# Patient Record
Sex: Male | Born: 1995 | Race: White | Hispanic: No | Marital: Single | Smoking: Never smoker
Health system: Southern US, Community
[De-identification: ages and names within clinical notes are randomized; demographics above are authoritative.]

## PROBLEM LIST (undated history)

## (undated) DIAGNOSIS — F988 Other specified behavioral and emotional disorders with onset usually occurring in childhood and adolescence: Secondary | ICD-10-CM

## (undated) DIAGNOSIS — F909 Attention-deficit hyperactivity disorder, unspecified type: Secondary | ICD-10-CM

## (undated) HISTORY — DX: Attention-deficit hyperactivity disorder, unspecified type: F90.9

---
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
## 2015-05-30 IMAGING — CR DG FEMUR 2+V*L*
4 series · 4 of 4 positions shown · non-contrast
Comparison: None.

CLINICAL DATA: Status post fall while playing in the snow today
with penetrated wound of the left mid posterior thigh.

EXAM:
DG FEMUR 2+V*L*

[x femur proximal ap left]
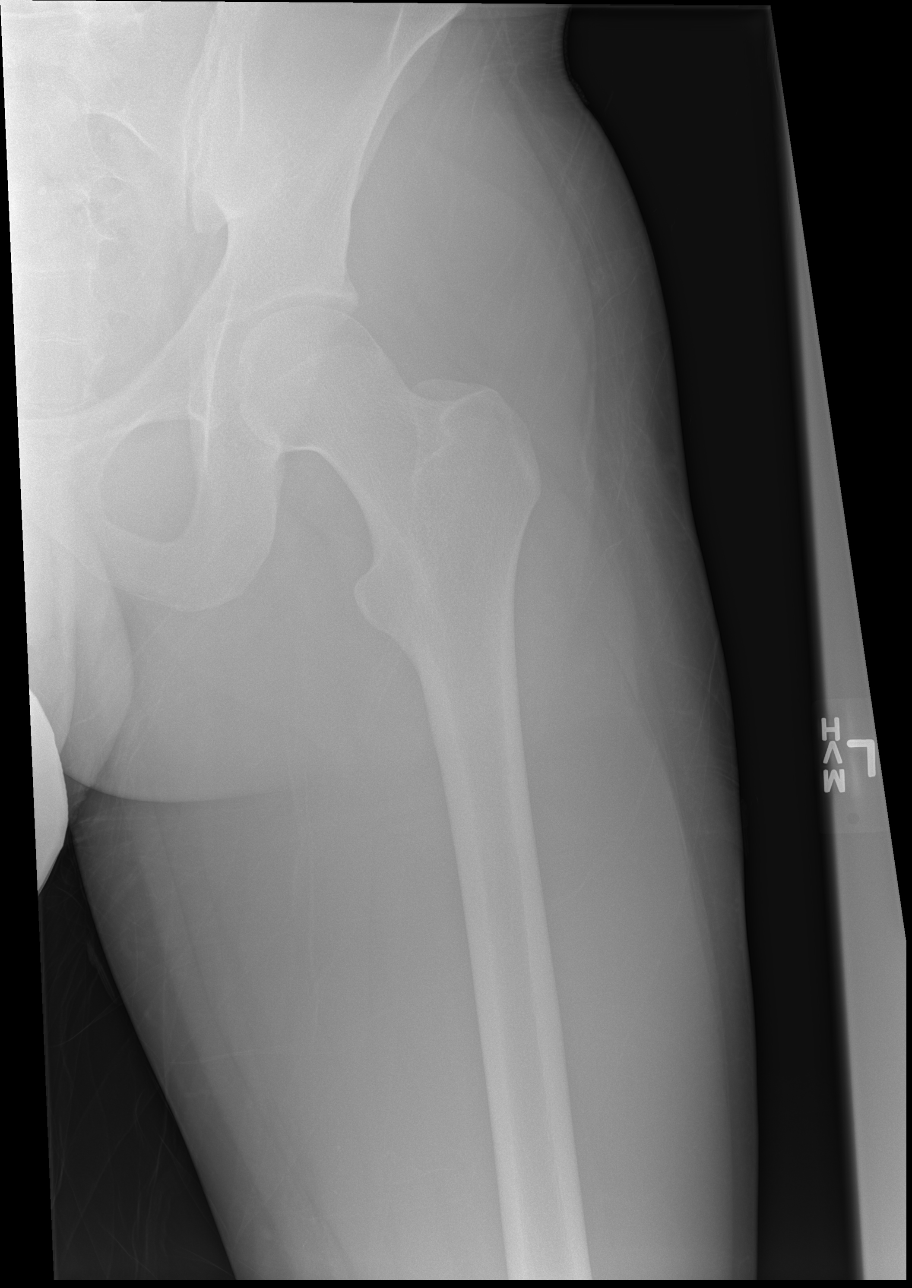

[x femur distal ap left]
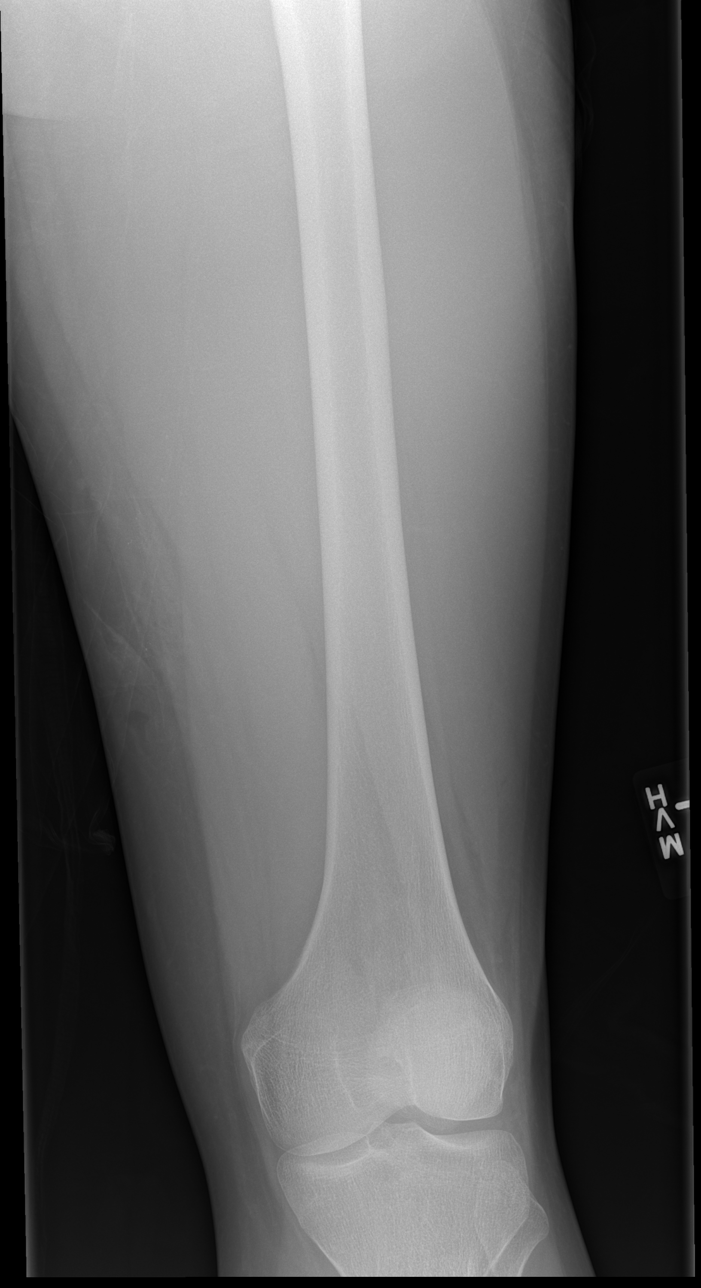

[x femur distal lat left]
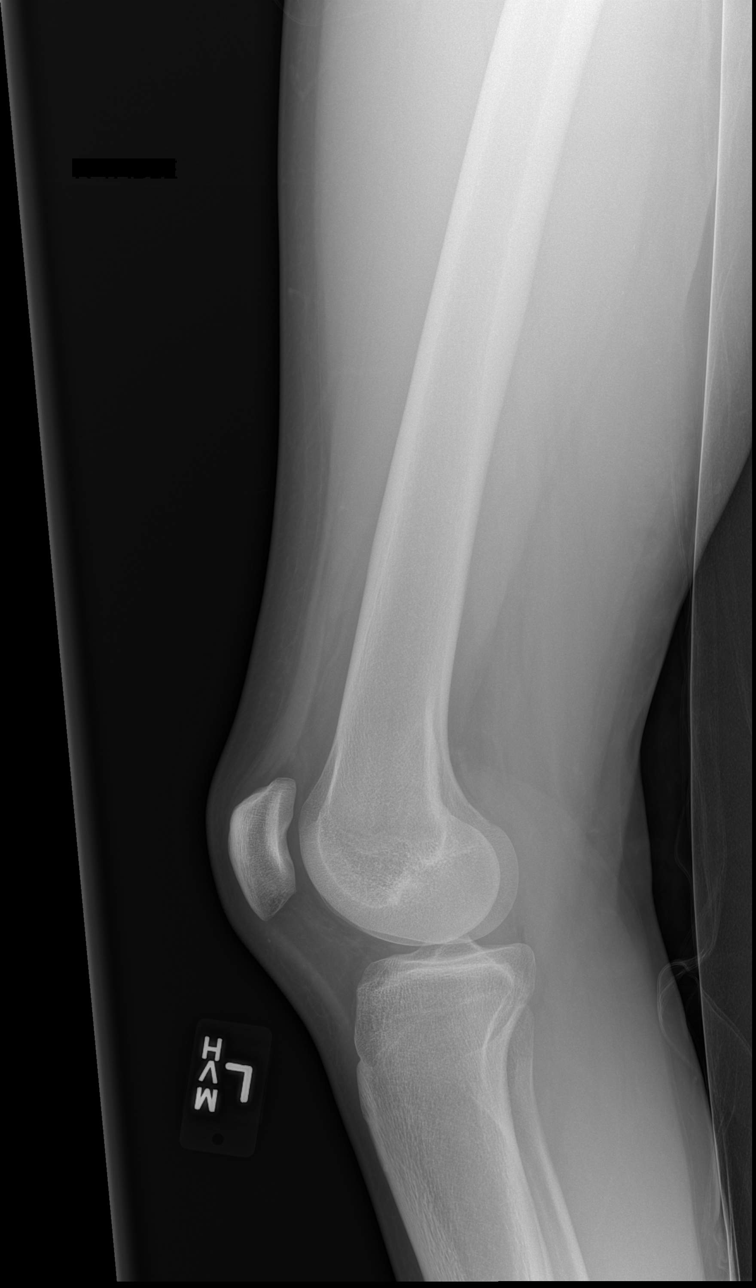

[w hip lat left]
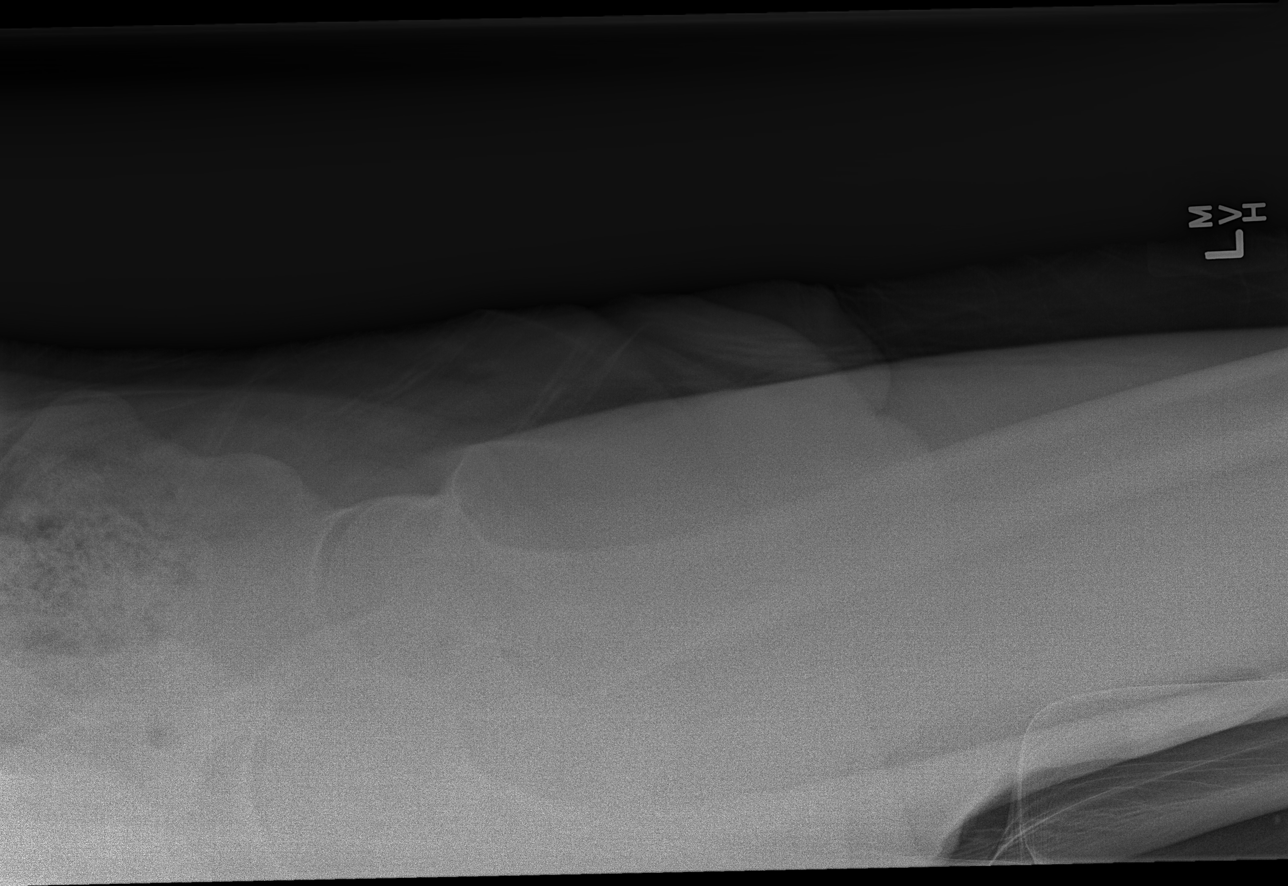

[4 of 4 positions shown; findings below may reference images not displayed]

FINDINGS: There is no evidence of fracture or other focal bone lesions. Soft
tissues are unremarkable. No radiopaque foreign body is noted.
IMPRESSION: No acute fracture or dislocation.  No radiopaque foreign body noted.

## 2016-08-07 ENCOUNTER — Telehealth: Payer: Self-pay | Admitting: *Deleted

## 2016-08-07 NOTE — Telephone Encounter (Signed)
CALLED PATIENT TO ASK QUESTION, LVM FOR A RETURN CALL 

## 2017-04-06 ENCOUNTER — Other Ambulatory Visit: Payer: Self-pay

## 2017-04-06 ENCOUNTER — Emergency Department (HOSPITAL_BASED_OUTPATIENT_CLINIC_OR_DEPARTMENT_OTHER)
Admission: EM | Admit: 2017-04-06 | Discharge: 2017-04-06 | Disposition: A | Discharge status: Home / Self Care | Payer: Worker's Compensation | Attending: Emergency Medicine | Admitting: Emergency Medicine

## 2017-04-06 ENCOUNTER — Encounter (HOSPITAL_BASED_OUTPATIENT_CLINIC_OR_DEPARTMENT_OTHER): Payer: Self-pay | Admitting: Emergency Medicine

## 2017-04-06 DIAGNOSIS — Y929 Unspecified place or not applicable: Secondary | ICD-10-CM | POA: Diagnosis not present

## 2017-04-06 DIAGNOSIS — Y939 Activity, unspecified: Secondary | ICD-10-CM | POA: Diagnosis not present

## 2017-04-06 DIAGNOSIS — S0181XA Laceration without foreign body of other part of head, initial encounter: Secondary | ICD-10-CM | POA: Insufficient documentation

## 2017-04-06 DIAGNOSIS — S0990XA Unspecified injury of head, initial encounter: Secondary | ICD-10-CM | POA: Diagnosis present

## 2017-04-06 DIAGNOSIS — Y990 Civilian activity done for income or pay: Secondary | ICD-10-CM | POA: Insufficient documentation

## 2017-04-06 DIAGNOSIS — W228XXA Striking against or struck by other objects, initial encounter: Secondary | ICD-10-CM | POA: Insufficient documentation

## 2017-04-06 DIAGNOSIS — Z79899 Other long term (current) drug therapy: Secondary | ICD-10-CM | POA: Insufficient documentation

## 2017-04-06 MED ORDER — BACITRACIN ZINC 500 UNIT/GM EX OINT
TOPICAL_OINTMENT | Freq: Once | CUTANEOUS | Status: AC
  Administered 2017-04-06: 1 via TOPICAL

## 2017-04-06 NOTE — Discharge Instructions (Signed)
You may gently clean the wound with soap and water. Make sure to pat dry the wound before covering it with any dressing. You can use topical antibiotic ointment and bandage. Ice for pain relief.   You can take Tylenol or Ibuprofen as directed for pain. You can alternate Tylenol and Ibuprofen every 4 hours for additional pain relief.   Monitor closely for any signs of infection. Return to the Emergency Department for any worsening redness/swelling of the area that begins to spread, drainage from the site, worsening pain, fever or any other worsening or concerning symptoms.

## 2017-04-06 NOTE — ED Provider Notes (Signed)
[facility] EMERGENCY DEPARTMENT Provider Note   CSN: [id number] Arrival date & time: 04/06/17  0116     History   Chief Complaint Chief Complaint  Patient presents with  . Head Laceration    HPI HOYLE BARKDULL is a 22 y.o. male this morning.  Patient states that he was at work when he hit the corner of his head on a metal cabinet.  Patient states that he did not have any loss of consciousness.  He is not currently on blood thinners.  Dad reports that he has been with him since the incident happened and states that he has been acting appropriately.  No confusion noted.  Patient has not had any vision changes, numbness/weakness of his arms or legs, vomiting.  Patient's tetanus is up-to-date.  The history is provided by the patient.    Past Medical History:  Diagnosis Date  . ADD (attention deficit disorder) 03/09/2014  . ADHD (attention deficit hyperactivity disorder)     Patient Active Problem List   Diagnosis Date Noted  . ADHD (attention deficit hyperactivity disorder) 03/07/2013    History reviewed. No pertinent surgical history.     Home Medications    Prior to Admission medications   Medication Sig Start Date End Date Taking? Authorizing Provider  atomoxetine (STRATTERA) 80 MG capsule Take 80 mg by mouth daily.    [provider]  Guaifenesin (MUCINEX MAXIMUM STRENGTH) 1200 MG TB12 Take 1 tablet (1,200 mg total) by mouth every 12 (twelve) hours as needed. 04/07/11   Porfirio Oar, PA-C  HYDROcodone-acetaminophen (NORCO/VICODIN) 5-325 MG per tablet Take 1-2 tablets by mouth every 4 (four) hours as needed for moderate pain or severe pain. 03/09/14   Ladona Mow, PA-C  ipratropium (ATROVENT) 0.03 % nasal spray Place 2 sprays into the nose 2 (two) times daily. 04/07/11 04/06/12  Porfirio Oar, PA-C  oseltamivir (TAMIFLU) 75 MG capsule Take 1 capsule (75 mg total) by mouth 2 (two) times daily. Patient not taking: Reported on 03/09/2014 03/07/13    Elvina Sidle, MD    Family History Family History  Problem Relation Age of Onset  . Hypertension Father     Social History Social History   Tobacco Use  . Smoking status: Never Smoker  Substance Use Topics  . Alcohol use: No  . Drug use: No     Allergies   Patient has no known allergies.   Review of Systems Review of Systems  Skin: Positive for wound.     Physical Exam Updated Vital Signs BP 126/88   Pulse 71   Temp 99 F (37.2 C) (Oral)   Resp 16   Ht 5\' 10"  (1.778 m)   Wt 74.8 kg (165 lb)   SpO2 100%   BMI 23.68 kg/m   Physical Exam  Constitutional: He is oriented to person, place, and time. He appears well-developed and well-nourished.  HENT:  Head: Normocephalic and atraumatic. Head is without raccoon's eyes and without Battle's sign.  Eyes: Conjunctivae and EOM are normal. Right eye exhibits no discharge. Left eye exhibits no discharge. No scleral icterus.  Pulmonary/Chest: Effort normal.  Neurological: He is alert and oriented to person, place, and time.  Cranial nerves III-XII intact Follows commands, Moves all extremities  5/5 strength to BUE and BLE  Sensation intact throughout all major nerve distributions No gait abnormalities  No slurred speech. No facial droop.   Skin: Skin is warm and dry. Capillary refill takes less than 2 seconds.  Small, 0.5 cm  superficial wound noted to the left parietal scalp.  No deep wound.  Psychiatric: He has a normal mood and affect. His speech is normal and behavior is normal.  Nursing note and vitals reviewed.    ED Treatments / Results  Labs (all labs ordered are listed, but only abnormal results are displayed) Labs Reviewed - No data to display  EKG  EKG Interpretation None       Radiology No results found.  Procedures Procedures (including critical care time)  Medications Ordered in ED Medications  bacitracin ointment (1 application Topical Given 04/06/17 0222)     Initial  Impression / Assessment and Plan / ED Course  I have reviewed the triage vital signs and the nursing notes.  Pertinent labs & imaging results that were available during my care of the patient were reviewed by me and considered in my medical decision making (see chart for details).     22 year old male who presents for evaluation of head laceration that occurred 2 hours prior to ED arrival.  Hit his parietal scalp on a metal cabinet at work.  No LOC.  He is on any blood thinners.  No vomiting, vision changes, numbness/weakness.  Tetanus is up-to-date. Patient is afebrile, non-toxic appearing, sitting comfortably on examination table.  Patient with no neuro deficits on exam. Given reassuring physical exam and [facility] criteria, no imaging is indicated at this time.  Exam reveals a small 0.5 cm superficial wound.  Full expiration of the wound shows it is not deep.  Foreign bodies noted.  Wound care provided in the department.  Given that wound is small and very superficial, does not need sutures or staples at this time.  We will plan to provide bacitracin in the ED.  Wound care instructions discussed with patient. Patient had ample opportunity for questions and discussion. All patient's questions were answered with full understanding. Strict return precautions discussed. Patient expresses understanding and agreement to plan.    Final Clinical Impressions(s) / ED Diagnoses   Final diagnoses:  Laceration of other part of head without foreign body, initial encounter    ED Discharge Orders    None       Rosana HoesLayden, Tanishka Drolet A, PA-C 04/06/17 0226    Shon BatonHorton, Courtney F, MD 04/06/17 [phone]

## 2017-04-06 NOTE — ED Triage Notes (Signed)
Pt has laceration to top of head from hitting it on metal at work.

## 2017-04-06 NOTE — ED Notes (Signed)
ED Provider at bedside.
